# Patient Record
Sex: Female | Born: 2005 | Race: White | Hispanic: No | Marital: Single
Health system: Southern US, Community
[De-identification: ages and names within clinical notes are randomized; demographics above are authoritative.]

## PROBLEM LIST (undated history)

## (undated) DIAGNOSIS — K5900 Constipation, unspecified: Secondary | ICD-10-CM

## (undated) HISTORY — DX: Constipation, unspecified: K59.00

---
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
## 2023-01-27 ENCOUNTER — Emergency Department: Payer: MEDICAID

## 2023-01-27 ENCOUNTER — Other Ambulatory Visit: Payer: Self-pay

## 2023-01-27 ENCOUNTER — Emergency Department
Admission: EM | Admit: 2023-01-27 | Discharge: 2023-01-27 | Disposition: A | Discharge status: Home / Self Care | Payer: MEDICAID | Attending: Emergency Medicine | Admitting: Emergency Medicine

## 2023-01-27 DIAGNOSIS — M546 Pain in thoracic spine: Secondary | ICD-10-CM | POA: Insufficient documentation

## 2023-01-27 DIAGNOSIS — S8012XA Contusion of left lower leg, initial encounter: Secondary | ICD-10-CM | POA: Insufficient documentation

## 2023-01-27 DIAGNOSIS — S161XXA Strain of muscle, fascia and tendon at neck level, initial encounter: Secondary | ICD-10-CM | POA: Diagnosis not present

## 2023-01-27 DIAGNOSIS — R519 Headache, unspecified: Secondary | ICD-10-CM | POA: Diagnosis not present

## 2023-01-27 DIAGNOSIS — Y92410 Unspecified street and highway as the place of occurrence of the external cause: Secondary | ICD-10-CM | POA: Diagnosis not present

## 2023-01-27 DIAGNOSIS — S199XXA Unspecified injury of neck, initial encounter: Secondary | ICD-10-CM | POA: Diagnosis present

## 2023-01-27 DIAGNOSIS — M7918 Myalgia, other site: Secondary | ICD-10-CM

## 2023-01-27 MED ORDER — NAPROXEN 500 MG PO TABS: 500.0000 mg | ORAL_TABLET | Freq: Two times a day (BID) | ORAL | 2 refills | Status: AC
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
## 2023-01-27 NOTE — ED Notes (Addendum)
Pt placed in c collar during triage
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
